# Patient Record
Sex: Female | Born: 1955 | Race: White | Hispanic: No | Smoking: Current every day smoker
Health system: Southern US, Community
[De-identification: ages and names within clinical notes are randomized; demographics above are authoritative.]

## PROBLEM LIST (undated history)

## (undated) DIAGNOSIS — K859 Acute pancreatitis without necrosis or infection, unspecified: Secondary | ICD-10-CM

## (undated) HISTORY — PX: OTHER SURGICAL HISTORY: SHX169

---

## 2002-07-02 ENCOUNTER — Emergency Department (HOSPITAL_COMMUNITY): Admission: EM | Admit: 2002-07-02 | Discharge: 2002-07-02 | Payer: Self-pay | Admitting: Emergency Medicine

## 2005-09-14 ENCOUNTER — Ambulatory Visit (HOSPITAL_COMMUNITY): Admission: RE | Admit: 2005-09-14 | Discharge: 2005-09-15 | Payer: Self-pay | Admitting: Neurological Surgery

## 2006-01-21 ENCOUNTER — Emergency Department (HOSPITAL_COMMUNITY): Admission: EM | Admit: 2006-01-21 | Discharge: 2006-01-21 | Payer: Self-pay | Admitting: Emergency Medicine

## 2017-05-21 ENCOUNTER — Inpatient Hospital Stay (HOSPITAL_COMMUNITY)
Admission: EM | Admit: 2017-05-21 | Discharge: 2017-05-24 | DRG: 439 | Disposition: A | Discharge status: Home / Self Care | Payer: Medicare Other | Attending: Internal Medicine | Admitting: Internal Medicine

## 2017-05-21 ENCOUNTER — Encounter (HOSPITAL_COMMUNITY): Payer: Self-pay

## 2017-05-21 ENCOUNTER — Emergency Department (HOSPITAL_COMMUNITY): Payer: Medicare Other

## 2017-05-21 DIAGNOSIS — D751 Secondary polycythemia: Secondary | ICD-10-CM | POA: Diagnosis present

## 2017-05-21 DIAGNOSIS — F17210 Nicotine dependence, cigarettes, uncomplicated: Secondary | ICD-10-CM | POA: Diagnosis present

## 2017-05-21 DIAGNOSIS — J449 Chronic obstructive pulmonary disease, unspecified: Secondary | ICD-10-CM | POA: Diagnosis present

## 2017-05-21 DIAGNOSIS — Z888 Allergy status to other drugs, medicaments and biological substances status: Secondary | ICD-10-CM | POA: Diagnosis not present

## 2017-05-21 DIAGNOSIS — B182 Chronic viral hepatitis C: Secondary | ICD-10-CM | POA: Diagnosis present

## 2017-05-21 DIAGNOSIS — R109 Unspecified abdominal pain: Secondary | ICD-10-CM

## 2017-05-21 DIAGNOSIS — Z9049 Acquired absence of other specified parts of digestive tract: Secondary | ICD-10-CM | POA: Diagnosis not present

## 2017-05-21 DIAGNOSIS — Z79891 Long term (current) use of opiate analgesic: Secondary | ICD-10-CM

## 2017-05-21 DIAGNOSIS — E039 Hypothyroidism, unspecified: Secondary | ICD-10-CM | POA: Diagnosis present

## 2017-05-21 DIAGNOSIS — G8929 Other chronic pain: Secondary | ICD-10-CM | POA: Diagnosis present

## 2017-05-21 DIAGNOSIS — Q453 Other congenital malformations of pancreas and pancreatic duct: Secondary | ICD-10-CM

## 2017-05-21 DIAGNOSIS — K859 Acute pancreatitis without necrosis or infection, unspecified: Secondary | ICD-10-CM | POA: Diagnosis present

## 2017-05-21 DIAGNOSIS — K861 Other chronic pancreatitis: Secondary | ICD-10-CM | POA: Diagnosis present

## 2017-05-21 DIAGNOSIS — K8050 Calculus of bile duct without cholangitis or cholecystitis without obstruction: Secondary | ICD-10-CM

## 2017-05-21 DIAGNOSIS — I872 Venous insufficiency (chronic) (peripheral): Secondary | ICD-10-CM | POA: Diagnosis present

## 2017-05-21 HISTORY — DX: Acute pancreatitis without necrosis or infection, unspecified: K85.90

## 2017-05-21 LAB — TYPE AND SCREEN
ABO/RH(D): B NEG
Antibody Screen: NEGATIVE

## 2017-05-21 LAB — URINALYSIS, ROUTINE W REFLEX MICROSCOPIC
Bilirubin Urine: NEGATIVE
Glucose, UA: NEGATIVE mg/dL
Hgb urine dipstick: NEGATIVE
Ketones, ur: 15 mg/dL — AB
Leukocytes, UA: NEGATIVE
Nitrite: NEGATIVE
Protein, ur: NEGATIVE mg/dL
Specific Gravity, Urine: 1.015 (ref 1.005–1.030)
pH: 6 (ref 5.0–8.0)

## 2017-05-21 LAB — COMPREHENSIVE METABOLIC PANEL
ALT: 30 U/L (ref 14–54)
AST: 43 U/L — ABNORMAL HIGH (ref 15–41)
Albumin: 4.1 g/dL (ref 3.5–5.0)
Alkaline Phosphatase: 138 U/L — ABNORMAL HIGH (ref 38–126)
Anion gap: 12 (ref 5–15)
BUN: 9 mg/dL (ref 6–20)
CO2: 23 mmol/L (ref 22–32)
Calcium: 9.6 mg/dL (ref 8.9–10.3)
Chloride: 104 mmol/L (ref 101–111)
Creatinine, Ser: 0.76 mg/dL (ref 0.44–1.00)
GFR calc Af Amer: >60 mL/min (ref >60)
GFR calc non Af Amer: >60 mL/min (ref >60)
Glucose, Bld: 184 mg/dL — ABNORMAL HIGH (ref 65–99)
Potassium: 3.4 mmol/L — ABNORMAL LOW (ref 3.5–5.1)
Sodium: 139 mmol/L (ref 135–145)
Total Bilirubin: 0.7 mg/dL (ref 0.3–1.2)
Total Protein: 7.9 g/dL (ref 6.5–8.1)

## 2017-05-21 LAB — I-STAT BETA HCG BLOOD, ED (MC, WL, AP ONLY): I-stat hCG, quantitative: <5 m[IU]/mL (ref <5)

## 2017-05-21 LAB — RAPID URINE DRUG SCREEN, HOSP PERFORMED
Amphetamines: NOT DETECTED
Barbiturates: NOT DETECTED
Benzodiazepines: NOT DETECTED
Cocaine: NOT DETECTED
Opiates: POSITIVE — AB
Tetrahydrocannabinol: NOT DETECTED

## 2017-05-21 LAB — CBC
HCT: 53.7 % — ABNORMAL HIGH (ref 36.0–46.0)
Hemoglobin: 17.8 g/dL — ABNORMAL HIGH (ref 12.0–15.0)
MCH: 30.7 pg (ref 26.0–34.0)
MCHC: 33.1 g/dL (ref 30.0–36.0)
MCV: 92.7 fL (ref 78.0–100.0)
Platelets: 199 10*3/uL (ref 150–400)
RBC: 5.79 MIL/uL — ABNORMAL HIGH (ref 3.87–5.11)
RDW: 13.6 % (ref 11.5–15.5)
WBC: 12.8 10*3/uL — ABNORMAL HIGH (ref 4.0–10.5)

## 2017-05-21 LAB — LIPASE, BLOOD: Lipase: 6340 U/L — ABNORMAL HIGH (ref 11–51)

## 2017-05-21 LAB — ABO/RH: ABO/RH(D): B NEG

## 2017-05-21 LAB — ETHANOL: Alcohol, Ethyl (B): <5 mg/dL (ref <5)

## 2017-05-21 MED ORDER — SODIUM CHLORIDE 0.9 % IV SOLN
INTRAVENOUS | Status: DC
  Administered 2017-05-21 (×2): via INTRAVENOUS

## 2017-05-21 MED ORDER — LEVOTHYROXINE SODIUM 50 MCG PO TABS
50.0000 ug | ORAL_TABLET | Freq: Every day | ORAL | Status: DC
  Administered 2017-05-22 – 2017-05-24 (×3): 50 ug via ORAL
  Filled 2017-05-21 (×3): qty 1

## 2017-05-21 MED ORDER — POTASSIUM CHLORIDE 10 MEQ/100ML IV SOLN
10.0000 meq | INTRAVENOUS | Status: AC
  Administered 2017-05-21 (×3): 10 meq via INTRAVENOUS
  Filled 2017-05-21 (×3): qty 100

## 2017-05-21 MED ORDER — HYDROMORPHONE HCL 1 MG/ML IJ SOLN
0.5000 mg | INTRAMUSCULAR | Status: AC | PRN
  Administered 2017-05-21 (×3): 0.5 mg via INTRAVENOUS
  Filled 2017-05-21 (×3): qty 0.5

## 2017-05-21 MED ORDER — ONDANSETRON HCL 4 MG/2ML IJ SOLN: 4.0000 mg | Freq: Four times a day (QID) | INTRAMUSCULAR | Status: DC | PRN

## 2017-05-21 MED ORDER — ENOXAPARIN SODIUM 40 MG/0.4ML ~~LOC~~ SOLN
40.0000 mg | SUBCUTANEOUS | Status: DC
  Administered 2017-05-21 – 2017-05-23 (×3): 40 mg via SUBCUTANEOUS
  Filled 2017-05-21 (×3): qty 0.4

## 2017-05-21 MED ORDER — ONDANSETRON HCL 4 MG PO TABS: 4.0000 mg | ORAL_TABLET | Freq: Four times a day (QID) | ORAL | Status: DC | PRN

## 2017-05-21 MED ORDER — ORAL CARE MOUTH RINSE
15.0000 mL | Freq: Two times a day (BID) | OROMUCOSAL | Status: DC
  Administered 2017-05-22 – 2017-05-23 (×4): 15 mL via OROMUCOSAL

## 2017-05-21 MED ORDER — SODIUM CHLORIDE 0.9 % IV SOLN
INTRAVENOUS | Status: DC
  Administered 2017-05-21 – 2017-05-22 (×6): via INTRAVENOUS

## 2017-05-21 MED ORDER — CHLORHEXIDINE GLUCONATE 0.12 % MT SOLN
15.0000 mL | Freq: Two times a day (BID) | OROMUCOSAL | Status: DC
  Administered 2017-05-22 – 2017-05-23 (×4): 15 mL via OROMUCOSAL
  Filled 2017-05-21 (×4): qty 15

## 2017-05-21 MED ORDER — HYDROMORPHONE HCL 1 MG/ML IJ SOLN
1.0000 mg | INTRAMUSCULAR | Status: DC | PRN
Start: 2017-05-21 — End: 2017-05-21
  Administered 2017-05-21: 1 mg via INTRAVENOUS
  Filled 2017-05-21: qty 1

## 2017-05-21 MED ORDER — SODIUM CHLORIDE 0.9 % IV BOLUS (SEPSIS)
1000.0000 mL | Freq: Once | INTRAVENOUS | Status: AC
  Administered 2017-05-21: 1000 mL via INTRAVENOUS

## 2017-05-21 MED ORDER — ONDANSETRON HCL 4 MG/2ML IJ SOLN
4.0000 mg | Freq: Once | INTRAMUSCULAR | Status: AC
  Administered 2017-05-21: 4 mg via INTRAVENOUS
  Filled 2017-05-21: qty 2

## 2017-05-21 MED ORDER — ONDANSETRON HCL 4 MG/2ML IJ SOLN: 4.0000 mg | Freq: Once | INTRAMUSCULAR | Status: DC

## 2017-05-21 MED ORDER — METHADONE HCL 10 MG PO TABS
130.0000 mg | ORAL_TABLET | Freq: Every day | ORAL | Status: DC
  Administered 2017-05-21 – 2017-05-24 (×4): 130 mg via ORAL
  Filled 2017-05-21: qty 26
  Filled 2017-05-21: qty 13
  Filled 2017-05-21: qty 26
  Filled 2017-05-21 (×3): qty 13

## 2017-05-21 MED ORDER — KETOROLAC TROMETHAMINE 30 MG/ML IJ SOLN
30.0000 mg | Freq: Four times a day (QID) | INTRAMUSCULAR | Status: DC | PRN
  Administered 2017-05-22 – 2017-05-23 (×4): 30 mg via INTRAVENOUS
  Filled 2017-05-21 (×4): qty 1

## 2017-05-21 MED ORDER — PROMETHAZINE HCL 25 MG/ML IJ SOLN
12.5000 mg | Freq: Once | INTRAMUSCULAR | Status: AC
  Administered 2017-05-21: 12.5 mg via INTRAVENOUS
  Filled 2017-05-21: qty 1

## 2017-05-21 NOTE — H&P (Signed)
Date: 05/21/2017               Patient Name:  Susan Ashley MRN: [id number]  DOB: Jul 05, 1956 Age / Sex: 61 y.o., female   PCP: Regino Bellow, MD         Medical Service: Internal Medicine Teaching Service         Attending Physician: Dr. Doneen Poisson, MD    First Contact: Zeb Comfort, MS IV  Pager: [phone]  Second Contact: Dr. John Giovanni Pager: [phone]       After Hours (After 5p/  First Contact Pager: [phone]  weekends / holidays): Second Contact Pager: [phone]   Chief Complaint: "Feeling sick to my stomach"  History of Present Illness: Patient is a 61 year old female with history of chronic pancreatitis with multiple admissions for acute-on-chronic pancreatitis, presenting with 4 days of worsening abdominal pain and nausea, as well as vomiting that started this morning. She first noticed mild mid-epigastric pain and nausea late last week but had been able to eat normally without vomiting. This morning, she was driving to a clinic when she suddenly started vomiting multiple times. She describes her vomit as non-bilious and non-bloody. Her abdominal pain also became more severe and she says it radiated to her back. Her last bowel movement was this morning and was normal. She denies current EtOH use and says her last drink was >10 years ago. She is a current smoker and started smoking in the past year, and endorses smoking about half a pack per day. She has been taking her home medications for COPD and methadone and denies starting any new medicines or OTC meds recently. Patient underwent cholecystectomy a few years ago. She denies recent abdominal trauma. The patient says she has had episodes like this about once every 2 years. She has not had diarrhea or constipation, fevers, chills, chest pain, SOB, or other systemic symptoms that have gone along with her recent symptoms.  Meds:  Current Meds  Medication Sig  . methadone (DOLOPHINE) 5 MG tablet Take 130 mg by  mouth daily.   Medications listed in PCP note in care everywhere: albuterol sulfate HFA 108 (90 Base) MCG/ACT inhaler beclomethasone dipropionate 40 mcg/actuation inhaler escitalopram oxalate 10 mg tablet gabapentin 300 mg capsule levothyroxine sodium 50 mcg tablet meloxicam 7.5 mg tablet methadone 5 mg tablet but takes 25 mg QD Commonly known as: DOLOPHINE 25 mg, Oral omeprazole 20 mg capsule  Allergies: Allergies as of 05/21/2017 - Review Complete 05/21/2017  Allergen Reaction Noted  . Duloxetine Other (See Comments) 05/12/2013  . Tramadol Swelling 05/21/2017  . Tape Other (See Comments) 08/29/2013   Past Medical History:  Diagnosis Date  . Pancreatitis    Per Care Everywhere: 1. Hypothyroidism, unspecified type TSH  Comprehensive metabolic panel  Lipid panel  2. Chronic obstructive pulmonary disease, unspecified COPD type (*)  3. Tobacco use disorder  4. Chronic pain disorder  5. Opioid use, unspecified with unspecified opioid-induced disorder (*) HCV Antibody Reflex to Qual NAA  HIV 1/0/2 Ag/Ab 4th Generation Preliminary Test w/Rflx  6. Chronic venous insufficiency Ambulatory referral to Vascular Surgery  7. History of Hep C 8. GAD 9. MDD  Family History: Patient denies family history of abdominal problems or recurrent pancreatitis.   Social History: Patient lives with her two grandchildren at home whom she takes care of. She denies ongoing EtOH use as per the HPI above, and is a 0.5 PPD smoker x 1 year.    Review of Systems:  Review of Systems  Constitutional: Negative for chills, fever and weight loss.  HENT: Negative.   Eyes: Negative.   Respiratory: Negative for cough, hemoptysis and shortness of breath.   Cardiovascular: Negative for chest pain and palpitations.  Gastrointestinal: Positive for abdominal pain, nausea and vomiting. Negative for constipation and diarrhea.  Genitourinary: Negative.   Skin: Negative for rash.  Neurological: Negative.     Physical Exam: Blood pressure 100/84, pulse 75, temperature 98.5 F (36.9 C), temperature source Oral, resp. rate 18, height 5\' 4"  (1.626 m), weight 148 lb 12.8 oz (67.5 kg), SpO2 93 %.   Physical Exam  Constitutional: She is oriented to person, place, and time. She appears well-developed and well-nourished. No distress.  HENT:  Head: Normocephalic and atraumatic.  Right Ear: External ear normal.  Left Ear: External ear normal.  Mouth/Throat: Oropharynx is clear and moist.  Eyes: Conjunctivae and EOM are normal. Pupils are equal, round, and reactive to light. Right eye exhibits no discharge. Left eye exhibits no discharge. No scleral icterus.  Neck: Normal range of motion. Neck supple. No tracheal deviation present.  Cardiovascular: Normal rate, regular rhythm, normal heart sounds and intact distal pulses.  Exam reveals no gallop and no friction rub.   No murmur heard. Pulmonary/Chest: Effort normal and breath sounds normal. No stridor. No respiratory distress. She has no wheezes. She has no rales. She exhibits no tenderness.  Abdominal: Soft. She exhibits no distension and no mass. There is tenderness. There is no rebound and no guarding. No hernia.  Abdomen TTP in the epigastric and LUQ region  Neurological: She is alert and oriented to person, place, and time. No cranial nerve deficit. Coordination normal.  Skin: Skin is warm. Capillary refill takes less than 2 seconds. She is not diaphoretic. No erythema.  Nursing note and vitals reviewed.  EKG: Personally reviewed, sinus bradycardia  CXR: Per radiology borderline cardiomegaly and low lung volumes.   Assessment & Plan by Problem: Active Problems:   Acute on chronic pancreatitis (HCC)  1. Acute on chronic pancreatitis: Lipase on presentation elevated to 6340, given history and exam findings of midepigastric pain radiating to the back, her presentation is most consistent with acute pancreatitis. Patient endorses history of  recurrent episodes of acute pancreatitis similar to this occurring every 2 years, making acute on chronic picture likely. However does not have clear etiology since denies EtOH use for >10 years, no family history of pancreatitis and her lipid profile [facility] in June at [facility] did not reveal hypertriglyceridemia. Questionable whether methadone (home med) could be etiology of acute pancreatitis. For time being will treat for symptom improvement.  - Continue NS IVF at 300 mL/hr - Phenergan/zofran PRN for N/V - Pain control: Methadone as below and ketorolac 30 mg/mL IV q6hr PRN for severe pain  - NPO for today, will advance to clears tomorrow pending improved N/V   2. Hx of opioid dependence: Currently being followed by methadone clinic. Per patient, she takes 130 mg QD but records from her PCP at OSH indicate 25 mg QD.  Questionable whether methadone could be etiology of acute pancreatitis as above, but will restart for pain control at this point.  - Continue methadone 130 mg daily. This is the dose reported by the patient. We will speak to her methadone clinic in the morning to confirm dose.   3. Electrolyte derangements: Patient presented with K+ of 3.4, will replete and continue to monitor with BMP qAM. - KCl 10 mEq over 60 mins x  3 doses  4. Leukocytosis: Likely secondary to stress reaction.  -Will continue to monitor with repeat CBC in AM.  5. Polycythemia: Hgb 17.8 and Hct 53.7 on admission. Review of care everywhere records reveals high Hct since 08/2015. Likely in the setting of cigarette smoking.  - Will perform smoking cessation counseling during this admission.   6. Hypothyroidism, unspecified type: Patient reports taking levothyroxine 50 mg daily and stopped a week ago, TSH checked 05/08/2017 was 6.69 in care everywhere.  - Will resume levothyroxine replacement at 50 mg daily as she was not consistent with her use.   7. Chronic obstructive pulmonary disease: Stable. Uses albuterol PRN at  home and doesn't use Anoro or Qvar.  -Will restart home albuterol PRN.   8. Tobacco use disorder: Spoke with patient about her recently starting smoking.  - Will perform smoking cessation counseling during this admission.   9. Hx of Hep C: +Hep C antibody and +HCV RNA during recent labs done 05/08/2017 in care everywhere. -.Pt will need outpatient follow-up with infectious disease   10. GAD: Per PCP records, was prescribed Escitalopram 10 mg QD but patient is not taking.  - Will not restart during this hospitalization, defer to outpatient.   11. MDD: As above for GAD.   DVT ppx: Lovenox  Diet: NPO Code: FULL Dispo: Admit patient to Inpatient with expected length of stay greater than 2 midnights.  Attestation for Student Documentation:  I personally was present and performed or re-performed the history, physical exam and medical decision-making activities of this service and have verified that the service and findings are accurately documented in the student's note.  John Giovanni, MD 05/22/2017, 12:00 AM  Signed: John Giovanni, MD 05/21/2017, 11:59 PM  Pager: [phone]

## 2017-05-21 NOTE — ED Notes (Signed)
Report given to Selena BattenKim, RN 6E. Pt will be going to 6E-12.

## 2017-05-21 NOTE — ED Provider Notes (Signed)
[facility]-EMERGENCY DEPT Provider Note   CSN: [id number] Arrival date & time: 05/21/17  0845     History   Chief Complaint Chief Complaint  Patient presents with  . Abdominal Pain    HPI Susan GaviaBarbara Ashley is a 61 y.o. female.  HPI Patient presents to the emergency room for evaluation of acute severe upper abdominal pain that started this morning. The patient states she suddenly started having severe abdominal pain associated with episodes of nausea and vomiting. The pain is severe in her upper abdomen and radiates to her back. Patient has history of pancreatitis and this feels very similar to previous bouts of pancreatitis. She thinks her pancreatitis was caused by gallbladder problems. She had a cholecystectomy a few years ago. She has had recurrent bouts of pancreatitis since that time. She denies any history of alcohol abuse. History of aortic aneurysm. No history of heart disease. Past Medical History:  Diagnosis Date  . Pancreatitis   additional PMHX from Care everywhere 1. Hypothyroidism, unspecified type TSH  Comprehensive metabolic panel  Lipid panel  2. Chronic obstructive pulmonary disease, unspecified COPD type (*)  3. Tobacco use disorder  4. Chronic pain disorder  5. Opioid use, unspecified with unspecified opioid-induced disorder (*) HCV Antibody Reflex to Qual NAA  HIV 1/0/2 Ag/Ab 4th Generation Preliminary Test w/Rflx  6. Chronic venous insufficiency Ambulatory referral to Vascular Surgery  7. History of Hep C 8. GAD 9. MDD    Past Surgical History:  Procedure Laterality Date  . GI Bleeding      OB History    No data available       Home Medications    Prior to Admission medications   Medication Sig Start Date End Date Taking? Authorizing Provider  methadone (DOLOPHINE) 5 MG tablet Take 130 mg by mouth daily.   Yes [provider]  Home meds per care everywhere albuterol sulfate HFA 108 (90 Base) MCG/ACT inhaler beclomethasone dipropionate 40  mcg/actuation inhaler escitalopram oxalate 10 mg tablet gabapentin 300 mg capsule levothyroxine sodium 50 mcg tablet meloxicam 7.5 mg tablet methadone 5 mg tablet Commonly known as: DOLOPHINE 25 mg, Oral omeprazole 20 mg capsule      Family History No family history on file.  Social History Social History  Substance Use Topics  . Smoking status: Current Every Day Smoker    Packs/day: 0.50    Types: Cigarettes  . Smokeless tobacco: Never Used  . Alcohol use No     Allergies   Duloxetine; Tramadol; and Tape   Review of Systems Review of Systems  Respiratory: Negative for chest tightness and shortness of breath.   All other systems reviewed and are negative.    Physical Exam Updated Vital Signs BP (!) 157/79   Pulse 62   Temp 98 F (36.7 C) (Oral)   Resp (!) 26   Ht 1.626 m (5\' 4" )   Wt 63.5 kg (140 lb)   SpO2 94%   BMI 24.03 kg/m   Physical Exam  Constitutional: She appears distressed.  HENT:  Head: Normocephalic and atraumatic.  Right Ear: External ear normal.  Left Ear: External ear normal.  Eyes: Conjunctivae are normal. Right eye exhibits no discharge. Left eye exhibits no discharge. No scleral icterus.  Neck: Neck supple. No tracheal deviation present.  Cardiovascular: Normal rate, regular rhythm and intact distal pulses.   Pulmonary/Chest: Effort normal and breath sounds normal. No stridor. No respiratory distress. She has no wheezes. She has no rales.  Abdominal: Soft. Bowel sounds  are normal. She exhibits no distension and no pulsatile midline mass. There is generalized tenderness and tenderness in the right upper quadrant and epigastric area. There is guarding. There is no rigidity and no rebound. No hernia.  Musculoskeletal: She exhibits no edema or tenderness.  Neurological: She is alert. She has normal strength. No cranial nerve deficit (no facial droop, extraocular movements intact, no slurred speech) or sensory deficit. She exhibits normal  muscle tone. She displays no seizure activity. Coordination normal.  Skin: Skin is warm. No rash noted. She is diaphoretic.  Psychiatric: She has a normal mood and affect.  Nursing note and vitals reviewed.    ED Treatments / Results  Labs (all labs ordered are listed, but only abnormal results are displayed) Labs Reviewed  LIPASE, BLOOD - Abnormal; Notable for the following:       Result Value   Lipase 6,340 (*)    All other components within normal limits  COMPREHENSIVE METABOLIC PANEL - Abnormal; Notable for the following:    Potassium 3.4 (*)    Glucose, Bld 184 (*)    AST 43 (*)    Alkaline Phosphatase 138 (*)    All other components within normal limits  CBC - Abnormal; Notable for the following:    WBC 12.8 (*)    RBC 5.79 (*)    Hemoglobin 17.8 (*)    HCT 53.7 (*)    All other components within normal limits  ETHANOL  URINALYSIS, ROUTINE W REFLEX MICROSCOPIC  RAPID URINE DRUG SCREEN, HOSP PERFORMED  I-STAT BETA HCG BLOOD, ED ([facility], [facility], [facility] ONLY)  TYPE AND SCREEN  ABO/RH    EKG Sinus bradycardia with a rate of 53 Normal intervals Normal access Normal ST-T waves No prior EKG for comparison Radiology Dg Chest Portable 1 View  Result Date: 05/21/2017 CLINICAL DATA:  Upper abdominal pain. EXAM: PORTABLE CHEST 1 VIEW COMPARISON:  03/10/2015 . FINDINGS: Mediastinum hilar structures normal. Borderline cardiomegaly. No pulmonary venous congestion. Low lung volumes. No focal infiltrate. No pleural effusion or pneumothorax . IMPRESSION: 1. Borderline cardiomegaly. 2. Low lung volumes. Electronically Signed   By: Maisie Fus  Register   On: 05/21/2017 09:39    Procedures Procedures (including critical care time)  Medications Ordered in ED Medications  sodium chloride 0.9 % bolus 1,000 mL (0 mLs Intravenous Stopped 05/21/17 1025)    And  0.9 %  sodium chloride infusion ( Intravenous New Bag/Given 05/21/17 1034)  HYDROmorphone (DILAUDID) injection 0.5 mg (0.5 mg Intravenous Given  05/21/17 1146)  ondansetron (ZOFRAN) injection 4 mg (4 mg Intravenous Given 05/21/17 0920)  ondansetron (ZOFRAN) injection 4 mg (4 mg Intravenous Given 05/21/17 1032)  promethazine (PHENERGAN) injection 12.5 mg (12.5 mg Intravenous Given 05/21/17 1142)     Initial Impression / Assessment and Plan / ED Course  I have reviewed the triage vital signs and the nursing notes.  Pertinent labs & imaging results that were available during my care of the patient were reviewed by me and considered in my medical decision making (see chart for details).   patient presents to the emergency room with complaints of severe upper abdominal pain similar to previous bouts of pancreatitis.  Patient denies any alcohol use. Her laboratory tests are consistent with recurrent pancreatitis.  Unclear what is triggering this.  We'll consult with medical service for admission. She will need further treatment to manage her pain and vomiting as she continues to have symptoms despite IV Dilaudid and multiple anti-emetics.  The patient may also need further  investigation as to the cause of her recurrent pancreatitis.  Final Clinical Impressions(s) / ED Diagnoses   Final diagnoses:  Acute pancreatitis, unspecified complication status, unspecified pancreatitis type    New Prescriptions New Prescriptions   No medications on file     Linwood Dibbles, MD 05/21/17 1210

## 2017-05-21 NOTE — ED Notes (Signed)
Pt had episode of vomiting on the floor. Pt cleaned up, and room. Given another emesis bag. Dr. Lynelle DoctorKnapp made aware. Gave recent dose of phenergan.

## 2017-05-21 NOTE — ED Triage Notes (Signed)
Per Pt, Pt has hx of pancreatitis and report RUQ pain that started this morning that radiates to her back. Pt is diaphoretic and tearful. Reports N/V/D.

## 2017-05-21 NOTE — ED Notes (Signed)
Family at bedside, pt is sleeping, resting comfortably. No further episodes of vomiting. Awaiting bed assignment.

## 2017-05-21 NOTE — ED Notes (Signed)
Spoke with admitting MD, requesting pain meds and nausea meds PRN. Reports they will be down to see patient soon and will place orders when they see the patient. Family at bedside.

## 2017-05-22 ENCOUNTER — Inpatient Hospital Stay (HOSPITAL_COMMUNITY): Payer: Medicare Other

## 2017-05-22 DIAGNOSIS — Q453 Other congenital malformations of pancreas and pancreatic duct: Secondary | ICD-10-CM

## 2017-05-22 DIAGNOSIS — K861 Other chronic pancreatitis: Secondary | ICD-10-CM

## 2017-05-22 DIAGNOSIS — D751 Secondary polycythemia: Secondary | ICD-10-CM

## 2017-05-22 DIAGNOSIS — K859 Acute pancreatitis without necrosis or infection, unspecified: Principal | ICD-10-CM

## 2017-05-22 LAB — BASIC METABOLIC PANEL
ANION GAP: 6 (ref 5–15)
BUN: 6 mg/dL (ref 6–20)
CALCIUM: 7 mg/dL — AB (ref 8.9–10.3)
CHLORIDE: 112 mmol/L — AB (ref 101–111)
CO2: 20 mmol/L — ABNORMAL LOW (ref 22–32)
Creatinine, Ser: 0.54 mg/dL (ref 0.44–1.00)
GFR calc non Af Amer: >60 mL/min (ref >60)
Glucose, Bld: 79 mg/dL (ref 65–99)
Potassium: 3.5 mmol/L (ref 3.5–5.1)
SODIUM: 138 mmol/L (ref 135–145)

## 2017-05-22 LAB — CBC
HCT: 48.2 % — ABNORMAL HIGH (ref 36.0–46.0)
HEMOGLOBIN: 15 g/dL (ref 12.0–15.0)
MCH: 29.3 pg (ref 26.0–34.0)
MCHC: 31.1 g/dL (ref 30.0–36.0)
MCV: 94.1 fL (ref 78.0–100.0)
Platelets: 143 10*3/uL — ABNORMAL LOW (ref 150–400)
RBC: 5.12 MIL/uL — ABNORMAL HIGH (ref 3.87–5.11)
RDW: 14 % (ref 11.5–15.5)
WBC: 11.5 10*3/uL — AB (ref 4.0–10.5)

## 2017-05-22 LAB — PROTIME-INR
INR: 1.35
PROTHROMBIN TIME: 16.8 s — AB (ref 11.4–15.2)

## 2017-05-22 LAB — HIV ANTIBODY (ROUTINE TESTING W REFLEX): HIV SCREEN 4TH GENERATION: NONREACTIVE

## 2017-05-22 MED ORDER — SODIUM CHLORIDE 0.9 % IV SOLN
INTRAVENOUS | Status: DC
  Administered 2017-05-22 – 2017-05-23 (×3): via INTRAVENOUS

## 2017-05-22 MED ORDER — PANCRELIPASE (LIP-PROT-AMYL) 36000-114000 UNITS PO CPEP
72000.0000 [IU] | ORAL_CAPSULE | Freq: Three times a day (TID) | ORAL | Status: DC
  Administered 2017-05-22 – 2017-05-24 (×5): 72000 [IU] via ORAL
  Filled 2017-05-22 (×5): qty 2

## 2017-05-22 NOTE — Progress Notes (Signed)
Subjective: Patient feels better this AM and has not had further episodes of nausea or vomiting. She feels hungry and is willing to try eating jello. She feels her pain is improved and better than it was yesterday. We discussed the plan regarding getting further imaging studies, the patient understands and is in agreement.   Objective:  Vital signs in last 24 hours: Vitals:   05/21/17 1652 05/21/17 2133 05/22/17 0439 05/22/17 0906  BP: (!) 156/74 100/84 124/63 (!) 141/66  Pulse: 69 75 68 73  Resp: 19 18 17 18   Temp: 98.6 F (37 C) 98.5 F (36.9 C) 98.4 F (36.9 C) 99.1 F (37.3 C)  TempSrc: Oral Oral  Oral  SpO2: 94% 93% 90% 92%  Weight: 67.5 kg (148 lb 12.8 oz)     Height:       Physical Exam  Constitutional: She is oriented to person, place, and time and well-developed, well-nourished, and in no distress. No distress.  HENT:  Head: Normocephalic and atraumatic.  Eyes: Pupils are equal, round, and reactive to light.  Neck: Normal range of motion.  Cardiovascular: Normal rate and regular rhythm.   Pulmonary/Chest: Effort normal and breath sounds normal. No respiratory distress. She has no wheezes.  Abdominal: Soft. Bowel sounds are normal. She exhibits no distension. There is no tenderness.  Neurological: She is alert and oriented to person, place, and time. No cranial nerve deficit.  Skin: Skin is warm and dry. She is not diaphoretic.   Lab Results  Component Value Date   WBC 11.5 (H) 05/22/2017   HGB 15.0 05/22/2017   HCT 48.2 (H) 05/22/2017   MCV 94.1 05/22/2017   PLT 143 (L) 05/22/2017   Lab Results  Component Value Date   NA 138 05/22/2017   K 3.5 05/22/2017   CL 112 (H) 05/22/2017   CO2 20 (L) 05/22/2017   Imaging:  RUQ U/S IMPRESSION: Dilated common bile duct containing 2 stones measuring up to 10 mm in diameter. This is new since the previous study. No intrahepatic ductal dilation. Increased hepatic echotexture compatible with fatty infiltrative  change. Previous cholecystectomy.  MRCP would be a useful next imaging step.  Electronically Signed   By: David  [place] M.D.   On: 05/22/2017 10:05  Assessment/Plan:  Active Problems:   Acute on chronic pancreatitis (HCC)  1. Acute on chronic pancreatitis: Patient is hungry today and willing to try clears. Her N/V has been well controlled. Recurrent episodes for several years. RUQ U/S today showed 2 stones measuring up to 10 mm in diameter in a dilated common bile duct, a new finding compared to prior RUQ U/S. Reducing fluid rate from 300 to 150 mL/hr. Spoke with [facility] who will see her here in the hospital. They recommend MRCP as next step for further imaging analysis of pancreatic head and CBD. Do not recommend ursodiol/other medical ppx for choledocholethiasis in setting of cholecystectomy as has not been shown to be effective. They will see her and make recs. - F/u GI recs  - F/u MRCP  - Advance to clears today - Continue NS IVF at 150 mL/hr - Zofran PRN for N/V  2. Pain control, hx of chronic pain and opioid dependence: Currently being followed by [facility] methadone clinic. Per patient, she takes 130 mg QD but records from her PCP at OSH indicate 25 mg QD. Have contacted methadone clinic, waiting to hear back.  - Continue methadone 130 mg daily, f/u methadone clinic  - Continue ketorolac 30 mg/mL  IV q6hr PRN for severe pain   Electrolyte derangements: Patient presented with K+ of 3.4, repleted with 10 mEq over 60 mins x  3 doses and now 3.5. Will CTM and replete PRN.  Leukocytosis: Likely secondary to stress reaction, WBC downtrended.  Polycythemia: Hgb 17.8 and Hct 53.7 on admission. Review of care everywhere records reveals high Hct since 08/2015. Likely in the setting of cigarette smoking.  - Will perform smoking cessation counseling during this admission.   Hypothyroidism, unspecified type: Patient reports taking levothyroxine 50 mg daily and stopped a week ago, TSH  checked 05/08/2017 was 6.69 in care everywhere. Will resume levothyroxine replacement at 50 mg daily as she was not consistent with her use.  - Continue home levothyroxine 50 mg   Chronic obstructive pulmonary disease: Stable. Uses albuterol PRN at home and doesn't use Anoro or Qvar.   Continue home albuterol PRN.   Tobacco use disorder: Will perform smoking cessation counseling during this admission.   Hx of Hep C: +Hep C antibody and +HCV RNA during recent labs done 05/08/2017 in care everywhere. - Pt will need outpatient follow-up with infectious disease   GAD: Per PCP records, was prescribed Escitalopram 10 mg QD but patient is not taking. Will not restart during this hospitalization, defer to outpatient.   Dispo: Anticipated discharge in approximately 1 day(s).   Zeb ComfortSaripalli, Tadao Emig, Medical Student 05/22/2017, 11:51 AM Pager: [phone]

## 2017-05-22 NOTE — Consult Note (Signed)
Referring Provider:  Dr. Loney Loh Primary Care Physician:  Regino Bellow, MD Primary Gastroenterologist:  Gentry Fitz  Reason for Consultation:  CBD stones  HPI: Susan Ashley is a 61 y.o. female with past medical history of chronic pancreatitis, chronic hepatitis C - treatment nave , COPD, history of cocaine use, currently on long-term methadone admitted to the hospital with abdominal pain. Upon initial evaluation, patient was found to have elevated lipase of 6340. WBC of 12.8, alkaline phosphatase 138, normal total bilirubin, normal ALT and AST of 43. Patient had ultrasound abdomen performed today which revealed CBD of 9.2 mm with possible common bile duct stones. GI is consulted for further evaluation.   Patient seen and examined at the bedside. According to patient he started noticing epigastric pain which was sharp and severe in nature with radiation to the back around 2 days ago. It was associated with nausea and one episode of non-bloody vomiting. Patient denied any fever. Denied diarrhea or constipation. Denied any blood in the stool or black stool. Her abdominal pain is improving since admission. She is able to tolerate diet without any further nausea or vomiting.    Patient had colonoscopy in the past but does not recall the date or the results of the procedure.  Past Medical History:  Diagnosis Date  . Pancreatitis     Past Surgical History:  Procedure Laterality Date  . GI Bleeding      Prior to Admission medications   Medication Sig Start Date End Date Taking? Authorizing Provider  methadone (DOLOPHINE) 5 MG tablet Take 130 mg by mouth daily.   Yes [provider]    Scheduled Meds: . chlorhexidine  15 mL Mouth Rinse BID  . enoxaparin (LOVENOX) injection  40 mg Subcutaneous Q24H  . levothyroxine  50 mcg Oral QAC breakfast  . mouth rinse  15 mL Mouth Rinse q12n4p  . methadone  130 mg Oral Daily   Continuous Infusions: . sodium chloride 150 mL/hr at  05/22/17 1421   PRN Meds:.ketorolac, ondansetron **OR** ondansetron (ZOFRAN) IV  Allergies as of 05/21/2017 - Review Complete 05/21/2017  Allergen Reaction Noted  . Duloxetine Other (See Comments) 05/12/2013  . Tramadol Swelling 05/21/2017  . Tape Other (See Comments) 08/29/2013    No family history on file.  Social History   Social History  . Marital status: Legally Separated    Spouse name: N/A  . Number of children: N/A  . Years of education: N/A   Occupational History  . Not on file.   Social History Main Topics  . Smoking status: Current Every Day Smoker    Packs/day: 0.50    Types: Cigarettes  . Smokeless tobacco: Never Used  . Alcohol use No  . Drug use: No  . Sexual activity: Not on file   Other Topics Concern  . Not on file   Social History Narrative  . No narrative on file    Review of Systems: Review of Systems  Constitutional: Positive for malaise/fatigue. Negative for chills and fever.  HENT: Negative for ear discharge, ear pain, hearing loss and tinnitus.   Eyes: Negative for blurred vision and double vision.  Respiratory: Positive for cough. Negative for hemoptysis and sputum production.   Cardiovascular: Negative for chest pain and palpitations.  Gastrointestinal: Positive for abdominal pain, heartburn, nausea and vomiting. Negative for blood in stool and melena.  Genitourinary: Negative for dysuria and urgency.  Musculoskeletal: Negative for myalgias and neck pain.  Skin: Negative for itching and rash.  Neurological:  Negative for focal weakness and seizures.  Endo/Heme/Allergies: Does not bruise/bleed easily.  Psychiatric/Behavioral: Negative for hallucinations and suicidal ideas.    Physical Exam: Vital signs: Vitals:   05/22/17 0439 05/22/17 0906  BP: 124/63 (!) 141/66  Pulse: 68 73  Resp: 17 18  Temp: 98.4 F (36.9 C) 99.1 F (37.3 C)   Last BM Date: 05/21/17 Physical Exam  Constitutional: She is oriented to person, place, and  time. She appears well-developed and well-nourished. No distress.  HENT:  Head: Normocephalic and atraumatic.  Mouth/Throat: Oropharynx is clear and moist.  Eyes: EOM are normal. Left eye exhibits no discharge. No scleral icterus.  Neck: Normal range of motion. Neck supple. No thyromegaly present.  Cardiovascular: Normal rate, regular rhythm and normal heart sounds.   Pulmonary/Chest: Effort normal and breath sounds normal. No respiratory distress.  Abdominal: Soft. Bowel sounds are normal. She exhibits no distension. There is tenderness. There is no rebound and no guarding.  Epigastric tenderness to palpation without any rebound  Musculoskeletal: Normal range of motion. She exhibits no edema.  Neurological: She is alert and oriented to person, place, and time.  Skin: Skin is warm. No erythema.  Psychiatric: She has a normal mood and affect. Her behavior is normal.   GI:  Lab Results:  Recent Labs  05/21/17 0853 05/22/17 0551  WBC 12.8* 11.5*  HGB 17.8* 15.0  HCT 53.7* 48.2*  PLT 199 143*   BMET  Recent Labs  05/21/17 0853 05/22/17 0551  NA 139 138  K 3.4* 3.5  CL 104 112*  CO2 23 20*  GLUCOSE 184* 79  BUN 9 6  CREATININE 0.76 0.54  CALCIUM 9.6 7.0*   LFT  Recent Labs  05/21/17 0853  PROT 7.9  ALBUMIN 4.1  AST 43*  ALT 30  ALKPHOS 138*  BILITOT 0.7   PT/INR No results for input(s): LABPROT, INR in the last 72 hours.   Studies/Results: Dg Chest Portable 1 View  Result Date: 05/21/2017 CLINICAL DATA:  Upper abdominal pain. EXAM: PORTABLE CHEST 1 VIEW COMPARISON:  03/10/2015 . FINDINGS: Mediastinum hilar structures normal. Borderline cardiomegaly. No pulmonary venous congestion. Low lung volumes. No focal infiltrate. No pleural effusion or pneumothorax . IMPRESSION: 1. Borderline cardiomegaly. 2. Low lung volumes. Electronically Signed   By: Maisie Fus  Register   On: 05/21/2017 09:39   US Abdomen Limited Ruq  Result Date: 05/22/2017 CLINICAL DATA:  One day  history of right-sided abdominal pain. History of previous cholecystectomy, episodes of pancreatitis. EXAM: ULTRASOUND ABDOMEN LIMITED RIGHT UPPER QUADRANT COMPARISON:  Report of an abdominal ultrasound of March 02, 2013 FINDINGS: Gallbladder: The gallbladder is surgically absent. Common bile duct: Diameter: 9.2 cm.There are intraluminal echoes compatible with common bile duct stones. One measures 10 mm in the other 8 mm in diameter. Liver: The hepatic echotexture is mildly increased diffusely. There is no focal mass or ductal dilation. IMPRESSION: Dilated common bile duct containing 2 stones measuring up to 10 mm in diameter. This is new since the previous study. No intrahepatic ductal dilation. Increased hepatic echotexture compatible with fatty infiltrative change. Previous cholecystectomy. MRCP would be a useful next imaging step. Electronically Signed   By: David  [place] M.D.   On: 05/22/2017 10:05    Impression/Plan: - Abnormal ultrasound showing possible common bile duct stones. - Abnormal LFTs. Patient with chronically elevated alkaline phosphatase since December 2016 in the range of around 130 to 150 . AST of 43. Normal ALT are normal  - Abdominal pain. Most likely acute  on chronic pancreatitis. - Chronic pancreatitis. Etiology undetermined. Most likely from previous alcohol use - Chronic hepatitis C. Treatment nave.  Recommendations ------------------------- - MRI-MRCP for further evaluation and to confirm CBD stones. Patient is afebrile. Hemodynamically stable. No signs of ascending cholangitis. - start  Creon 72,00 unis with meals for chronic abdominal pain which could be from her chronic pancreatitis. - Etiology of pancreatitis undetermined. Patient stopped drinking alcohol more than 10 years ago. She is status post cholecystectomy. Normal triglycerides. No new medications. No family history of chronic pancreatitis. She does smokes cigarettes. Although patient is declining alcohol use,  her AST is elevated with normal ALT.  - Recommend outpatient treatment for hepatitis C once acute issues are resolved - Repeat LFTs for tomorrow. I will get PT/INR for tomorrow as well. - GI will follow.    LOS: 1 day   Kathi DerParag Derrious Bologna  MD, FACP 05/22/2017, 2:30 PM  Pager [phone] If no answer or after 5 PM call [phone]

## 2017-05-22 NOTE — H&P (Signed)
Internal Medicine Attending Admission Note Date: 05/22/2017  Patient name: Susan Ashley Medical record number: [id number] Date of birth: 16-Oct-1956 Age: 61 y.o. Gender: female  I saw and evaluated the patient. I reviewed the resident's note and I agree with the resident's findings and plan as documented in the resident's note.  Chief Complaint(s): Severe abdominal pain with nausea and vomiting.  History - key components related to admission:  Susan Ashley is a 61 year old woman with a history of chronic recurrent pancreatitis, chronic hepatitis C, opioid use disorder currently on methadone, chronic obstructive pulmonary disease, and prior cocaine abuse who presents with the acute onset of severe abdominal pain radiating to the back associated with nausea and vomiting. She was in her usual state of health until 2 days prior to admission when she noted the onset of abdominal pain. Initially it was mild but on the day of admission became severe and was associated with vomiting. She denies any recent alcohol use, abdominal trauma, or unusual foods. She is on no new medications and denies a personal history of hyperlipidemia or family history of pancreatitis. She previously underwent a cholecystectomy for cholelithiasis. With the severe abdominal pain she presented to the emergency department and was admitted to the internal medicine teaching service for further evaluation and care after she was found to have a lipase over 6000.  Her methadone was continued and she was treated with IV fluids and bowel rest. Her pain has improved although is still present. A right upper quadrant ultrasound was concerning for possible choledocholithiasis and an MRCP was limited by motion artifact and the patient terminating the study early. It could not be definitively determined if there was choledocholithiasis, but the radiologist commented on a pancreatic divisum.  Physical Exam - key components related to  admission:  Vitals:   05/21/17 1652 05/21/17 2133 05/22/17 0439 05/22/17 0906  BP: (!) 156/74 100/84 124/63 (!) 141/66  Pulse: 69 75 68 73  Resp: 19 18 17 18   Temp: 98.6 F (37 C) 98.5 F (36.9 C) 98.4 F (36.9 C) 99.1 F (37.3 C)  TempSrc: Oral Oral  Oral  SpO2: 94% 93% 90% 92%  Weight: 148 lb 12.8 oz (67.5 kg)     Height:       Gen. well-developed, well-nourished, woman lying comfortably in bed in no acute distress. She was engaged and cooperative with the exam. She appears at least 10 years older than her stated age. Abdomen: Mildly distended, soft, diffuse tenderness with the greatest discomfort in the epigastric area. There is no rebound. Bowel sounds are normoactive.  Lab results:  Basic Metabolic Panel:  Recent Labs  40/98/11 0853 05/22/17 0551  NA 139 138  K 3.4* 3.5  CL 104 112*  CO2 23 20*  GLUCOSE 184* 79  BUN 9 6  CREATININE 0.76 0.54  CALCIUM 9.6 7.0*   Liver Function Tests:  Recent Labs  05/21/17 0853  AST 43*  ALT 30  ALKPHOS 138*  BILITOT 0.7  PROT 7.9  ALBUMIN 4.1    Recent Labs  05/21/17 0853  LIPASE 6,340*   CBC:  Recent Labs  05/21/17 0853 05/22/17 0551  WBC 12.8* 11.5*  HGB 17.8* 15.0  HCT 53.7* 48.2*  MCV 92.7 94.1  PLT 199 143*   Coagulation:  Recent Labs  05/22/17 1606  INR 1.35   Urine Drug Screen:  Positive for opiates (expected) Negative for cocaine, benzodiazepines, amphetamines, THC, and barbiturates.  Alcohol Level:  Recent Labs  05/21/17 0917  ETH <5  Urinalysis:  Hazy, yellow, specific gravity 1.015, pH 6.0, ketones 15, nitrite negative, leukocytes negative.  Misc. Labs:  Beta hCG undetectable HIV antibody nonreactive  Imaging results:  Dg Chest Portable 1 View  Result Date: 05/21/2017 CLINICAL DATA:  Upper abdominal pain. EXAM: PORTABLE CHEST 1 VIEW COMPARISON:  03/10/2015 . FINDINGS: Mediastinum hilar structures normal. Borderline cardiomegaly. No pulmonary venous congestion. Low lung  volumes. No focal infiltrate. No pleural effusion or pneumothorax . IMPRESSION: 1. Borderline cardiomegaly. 2. Low lung volumes. Electronically Signed   By: Maisie Fushomas  Register   On: 05/21/2017 09:39   Mr Abdomen Mrcp W Wo Contast  Result Date: 05/22/2017 CLINICAL DATA:  Back pain and abdominal pain. Common bile duct stone and acute pancreatitis. Four days of worsening abdominal pain and nausea. Vomiting starting this morning. Sonography on 05/22/2017 showed choledocholithiasis. Chronic hepatitis c. EXAM: MRI ABDOMEN WITHOUT CONTRAST (INCLUDING MRCP) TECHNIQUE: Multiplanar multisequence MR imaging of the abdomen was performed without administration of intravenous contrast. Heavily T2-weighted images of the biliary and pancreatic ducts were obtained, and three-dimensional MRCP images were rendered by post processing. COMPARISON:  Ultrasound of 05/22/2017 and prior MRI from 09/22/2011 FINDINGS: Despite efforts by the technologist and patient, motion artifact is present on today's exam and could not be eliminated. This reduces exam sensitivity and specificity. The patient terminated the exam after acquisition of 3 sequences. The sequences are adversely affected by motion artifact. Lower chest: Atelectasis or pneumonia in the left lower lobe with adjacent small left pleural effusion. Upper normal heart size. Hepatobiliary: Pneumobilia with gas causing filling defects in the biliary system. The common bile duct is nearly completely gas field, and accordingly the fluid sensitive sequences used to characterize the common bile duct are of limited utility. There does appear to be a distal air fluid level in the CBD, and CBD caliber is 1.3 cm. Gallbladder absent. There is some mild intrahepatic biliary dilatation. I do not see any focal low liver lesions on the series which were obtained. Pancreas: Extensive peripancreatic edema compatible with acute pancreatitis. Pancreas divisum is present and is likely a predisposing  factor. No obvious gas along the pancreas. No focal fluid collection along the pancreas to suggest abscess. Acute peripancreatic fluid collections are present. Spleen:  Unremarkable Adrenals/Urinary Tract: Bilateral perirenal fluid without hydronephrosis. Adrenal glands normal. Stomach/Bowel: Unremarkable where included. Vascular/Lymphatic:  Unremarkable Other: In addition to the peripancreatic edema and retroperitoneal edema tracking in the perirenal spaces, there is moderate perihepatic ascites and mild perisplenic ascites with fluid tracking down in the paracolic gutters. Musculoskeletal: Unremarkable IMPRESSION: 1. Acute pancreatitis with peripancreatic acute fluid collections but no obvious pseudocyst or abscess at this time. Edema tracks in the perirenal spaces, along the retroperitoneum, and there is mild upper abdominal ascites. The patient has pancreas divisum which is likely predisposing to the recurrent pancreatitis. 2. There is extensive pneumobilia, with gas filling most of the common bile duct. This disrupts signal in the CBD and makes it difficult to tell if there are concurrent stones. It may be that the echogenic appearance on sonography was due to gas in the CBD rather than stones. The common bile duct measures 1.3 cm in diameter, much of which may be a physiologic response to cholecystectomy given the patient's history; back on 09/22/2011 the CBD measured 1.1 cm in diameter. Mild intrahepatic biliary dilatation. 3. Unfortunately the patient was only able to tolerate the acquisition of 3 sequences before terminating the exam. One of these was the MRCP sequence an we performed 3D analysis  from this. Again, because of all the gas in the biliary system, the fluid sensitive MRCP sequences are of limited utility in this patient. There is a significant degree of motion artifact on 2 of the 3 sequences obtained. 4. Airspace opacity at the left lung base compatible with atelectasis or pneumonia. Associated  left pleural effusion. Electronically Signed   By: Gaylyn Rong M.D.   On: 05/22/2017 17:41   US Abdomen Limited Ruq  Result Date: 05/22/2017 CLINICAL DATA:  One day history of right-sided abdominal pain. History of previous cholecystectomy, episodes of pancreatitis. EXAM: ULTRASOUND ABDOMEN LIMITED RIGHT UPPER QUADRANT COMPARISON:  Report of an abdominal ultrasound of March 02, 2013 FINDINGS: Gallbladder: The gallbladder is surgically absent. Common bile duct: Diameter: 9.2 cm.There are intraluminal echoes compatible with common bile duct stones. One measures 10 mm in the other 8 mm in diameter. Liver: The hepatic echotexture is mildly increased diffusely. There is no focal mass or ductal dilation. IMPRESSION: Dilated common bile duct containing 2 stones measuring up to 10 mm in diameter. This is new since the previous study. No intrahepatic ductal dilation. Increased hepatic echotexture compatible with fatty infiltrative change. Previous cholecystectomy. MRCP would be a useful next imaging step. Electronically Signed   By: David  [place] M.D.   On: 05/22/2017 10:05   I personally reviewed the portable chest x-ray in there were no effusions, infiltrates, or masses. There was some calcification of the wall of the descending thoracic aorta. I did not personally review the ultrasound or MRCP but did review the radiologist's report on those particular studies.  Other results:  EKG: Normal sinus bradycardia at 53 bpm, left axis deviation, normal intervals, no significant Q waves, LVH by voltage in aVL, poor R wave progression, low precordial voltage, no ST or T-wave changes. No previous ECGs for comparison.  Assessment & Plan by Problem:  Susan Ashley is a 61 year old woman with a history of chronic recurrent pancreatitis, chronic hepatitis C, opioid use disorder currently on methadone, chronic obstructive pulmonary disease, and prior cocaine abuse who presents with the acute onset of severe abdominal  pain radiating to the back associated with nausea and vomiting. Her presentation, examination, and laboratory studies are consistent with acute on chronic pancreatitis. She was found to have a pancreatic divisum on MRCP. She states she has had bouts of pancreatitis on at least 3 occasions that occurred at a frequency of about once every 2 years. Although controversial, at this point we have no other etiology for her chronic recurrent pancreatitis and she may be in that subset of symptomatic pancreatic divisum patients.  1) Acute on chronic pancreatitis: Pancreatic divisum was seen on MRCP and may be the etiology as no other causes are currently available to explain her course. We appreciate GI's evaluation and are awaiting their opinion regarding the results of the MRCP and whether or not further intervention to the minor papilla would be indicated. In the meantime, we will slowly advance her diet as tolerated and continue pain management with her baseline methadone. We will also initiate pancreatic enzyme replacement as recommended by GI.  2) Disposition: Pending the completion of GI's evaluation and recommendations as well as her ability to maintain hydration orally without difficulty.

## 2017-05-23 LAB — COMPREHENSIVE METABOLIC PANEL
ALT: 17 U/L (ref 14–54)
AST: 26 U/L (ref 15–41)
Albumin: 2.4 g/dL — ABNORMAL LOW (ref 3.5–5.0)
Alkaline Phosphatase: 84 U/L (ref 38–126)
Anion gap: 7 (ref 5–15)
BUN: 5 mg/dL — ABNORMAL LOW (ref 6–20)
CHLORIDE: 111 mmol/L (ref 101–111)
CO2: 21 mmol/L — AB (ref 22–32)
CREATININE: 0.58 mg/dL (ref 0.44–1.00)
Calcium: 7.5 mg/dL — ABNORMAL LOW (ref 8.9–10.3)
GFR calc Af Amer: >60 mL/min (ref >60)
Glucose, Bld: 57 mg/dL — ABNORMAL LOW (ref 65–99)
Potassium: 3.2 mmol/L — ABNORMAL LOW (ref 3.5–5.1)
Sodium: 139 mmol/L (ref 135–145)
Total Bilirubin: 1.5 mg/dL — ABNORMAL HIGH (ref 0.3–1.2)
Total Protein: 5.3 g/dL — ABNORMAL LOW (ref 6.5–8.1)

## 2017-05-23 LAB — LIPASE, BLOOD: LIPASE: 256 U/L — AB (ref 11–51)

## 2017-05-23 MED ORDER — ACETAMINOPHEN 325 MG PO TABS
650.0000 mg | ORAL_TABLET | Freq: Four times a day (QID) | ORAL | Status: DC | PRN
  Administered 2017-05-23: 650 mg via ORAL
  Filled 2017-05-23: qty 2

## 2017-05-23 NOTE — Progress Notes (Signed)
Pebble Easler 12:32 PM  Subjective: Patient doing much better and is about to have her diet advanced and her history was discussed and she does not remember a previous ERCP and even denies having a previous endoscopy or EUS and also [facility] [place] despite some care everywhere Notes and she has not been on pancreatic enzymes and is fine in between these attacks  Objective: Vital signs stable afebrile no acute distress abdomen is soft nontender lipase decreased significantly liver tests okay white count slightly elevated stable MRCP reviewed not helpful regarding CBD stones question of pancreatic divisum and changes of pancreatitis  Assessment: Recurrent pancreatitis questionable etiology  Plan: Agree with advancing diet and hopefully home soon and I gave her my card and I will see her back in the office in one to 2 weeks to discuss ERCP because based on the elevation of her lipase that does suggest a biliary etiology and I think we should offer her a one time ERCP when her pancreatitis settles down but with her pneumobilia she either passed a rather large stone  or has had a sphincterotomy before and as an aside on one of her previous CTs at an outlying hospital pneumobilia was seen there as well  [facility] ClinicMAGOD,Lysle Yero E  Pager [phone] After 5PM or if no answer call [phone]

## 2017-05-23 NOTE — Progress Notes (Signed)
   Subjective: Patient feels well this AM. She was able to tolerate clear liquids for lunch and dinner yesterday without nausea or emesis. She has not had a BM. The patient feels her abdominal pain is improved. She understands her test results from the MRI and agrees the plan.   Objective:  Vital signs in last 24 hours: Vitals:   05/22/17 0906 05/22/17 1900 05/23/17 0541 05/23/17 0851  BP: (!) 141/66 (!) 144/72 (!) 146/72 (!) 152/58  Pulse: 73 73 67 66  Resp: 18 17 16 16   Temp: 99.1 F (37.3 C) 99.4 F (37.4 C) 98.9 F (37.2 C) 98.3 F (36.8 C)  TempSrc: Oral Oral Oral Oral  SpO2: 92% 94% 93% 95%  Weight:  71.2 kg (157 lb)    Height:       Physical Exam  Constitutional: She appears well-developed and well-nourished. No distress.  HENT:  Head: Normocephalic and atraumatic.  Cardiovascular: Normal rate, regular rhythm and normal heart sounds.   No murmur heard. Pulmonary/Chest: Effort normal and breath sounds normal. She has no wheezes.  Abdominal: Soft. Bowel sounds are normal. She exhibits no distension.  Mild epigastric tenderness  Neurological: She is alert.  Skin: Skin is warm and dry.   Assessment/Plan:  Active Problems:   Acute on chronic pancreatitis (HCC)   Pancreatic divisum  1. Acute on chronic pancreatitis: Patient tolerated clear liquids well yesterday. She is hungry and will have clears for breakfast but is willing to try a soft solid diet for lunch.  - Advance to soft solids  -Discontinue IV fluids - Zofran PRN for N/V  2. Pancreatic divisum: RUQ U/S yesterday demonstrated ?CBD stones, however MRCP showed extensive pneumobilia with CBD to 1.3 cm and was unable to confirm stones. Radiologist commented on pancreatic divisum which could predispose her to recurrent pancreatitis. Will f/u with GI recs regarding further management of pancreatic divisum.  - Continue Creon w/ meals per GI  - F/u GI recs   3. Pain control: History of opioid dependence and [facility]. Per patient she takes methadone 130 mg. Have contacted pain clinic regarding her dose but given likelihood of discharge soon will not alter her regimen.  - Continue methadone 130 mg QD - Continue toradol PRN   4. Hypothyroidism: History of hypothyroidism at home on levo 50, was recently found to be sub-therapeutic per OSH lab but patient endorses not taking medications consistently. Therefore have restarted on home dose. - Continue levothyroxine 50 mg QD  -She will need thyroid studies repeated as outpatient   Diet: Soft solids DVT ppx: Lovenox  Dispo: Anticipated discharge in approximately 1 day(s).   Zeb ComfortSaripalli, Srinivas, Medical Student 05/23/2017, 9:52 AM Pager: [phone]  Attestation for Student Documentation:  I personally was present and performed or re-performed the history, physical exam and medical decision-making activities of this service and have verified that the service and findings are accurately documented in the student's note.  John Giovanniathore, Luvia Orzechowski, MD 05/23/2017, 10:23 AM

## 2017-05-23 NOTE — Progress Notes (Signed)
Internal Medicine Attending  Date: 05/23/2017  Patient name: Susan GaviaBarbara Ashley Medical record number: [id number] Date of birth: [birth year] Age: 61 y.o. Gender: female  I saw and evaluated the patient. I reviewed the resident/sub-intern's note by Dr. Troy Sineathore/Mr. Saripalli and I agree with the resident/sub-intern's findings and plans as documented in their progress note.  When seen on rounds this morning she was feeling better and tolerated the clear liquid diet well. She is ready for slow advancement of the diet. Examination revealed less abdominal tenderness than the day before. We are awaiting GI's opinion on whether or not further intervention would be prudent given the pancreatic divisum and her recurrent pancreatitis.

## 2017-05-24 DIAGNOSIS — K859 Acute pancreatitis without necrosis or infection, unspecified: Secondary | ICD-10-CM

## 2017-05-24 MED ORDER — PANCRELIPASE (LIP-PROT-AMYL) 36000-114000 UNITS PO CPEP: 72000.0000 [IU] | ORAL_CAPSULE | Freq: Three times a day (TID) | ORAL | 0 refills | Status: AC

## 2017-05-24 MED ORDER — LEVOTHYROXINE SODIUM 50 MCG PO TABS: 50.0000 ug | ORAL_TABLET | Freq: Every day | ORAL | 0 refills | Status: AC

## 2017-05-24 NOTE — Progress Notes (Signed)
   Subjective: Feels well, had a normal BM, no N/V, tolerated regular diet yesterday, pain is significantly improved. Feels ready to go home today.   Objective:  Vital signs in last 24 hours: Vitals:   05/23/17 1824 05/23/17 2044 05/24/17 0527 05/24/17 0942  BP: (!) 153/72 (!) 150/84 132/64 (!) 149/71  Pulse: 78 73 70 70  Resp: 17 16 16 17   Temp: 99.7 F (37.6 C) (!) 100.4 F (38 C) 99.1 F (37.3 C) 99.3 F (37.4 C)  TempSrc: Oral Oral Oral Oral  SpO2: 90% 93% 93% 93%  Weight:  70.8 kg (156 lb)    Height:  5' 3.5" (1.613 m)     Physical Exam  Constitutional: She is oriented to person, place, and time. She appears well-developed and well-nourished. No distress.  HENT:  Head: Normocephalic.  Cardiovascular: Normal rate, regular rhythm and normal heart sounds.   Pulmonary/Chest: Breath sounds normal.  Abdominal: Soft. She exhibits no distension.  No significant tenderness on exam.  Musculoskeletal: She exhibits no edema.  Neurological: She is alert and oriented to person, place, and time.   Active Problems:   Acute on chronic pancreatitis (HCC)   Pancreatic divisum  Assessment/Plan:  1. Acute on chronic pancreatitis, resolved: Tolerating soft solid diet. Counseled about avoiding greasy foods as much as possible while at home and staying hydrated. Has some residual epigastric pain though significant decreased from prior.   She will be started on pancreatic enzymes TID.  2. Pancreatic divisum: Per GI, they will see her as an outpatient for possible ERCP in 2 weeks.   3. 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 Will not need any further doses today. Encourage her to follow up with her pain clinic.   4. Hypothyroidism: Restarted home levothyroxine 50. Denies side effects. Encouraged patient to be compliant as she was previously subtherapeutic. She will need repeat thyroid studies as an outpatient to ensure adherence and that she is therapeutic.  Diet:  Regular DVT ppx: Lovenox Dispo: Anticipated discharge today.  Zeb ComfortSaripalli, Srinivas, Medical Student 05/24/2017, 1:09 PM Pager: [phone]  Attestation for Student Documentation:  I personally was present and performed or re-performed the history, physical exam and medical decision-making activities of this service and have verified that the service and findings are accurately documented in the student's note.  Gwynn BurlyWallace, Rina Adney, DO 05/24/2017, 4:52 PM

## 2017-05-24 NOTE — Progress Notes (Signed)
[facility] Progress Note  Bettina GaviaBarbara Ardelean 61 y.o. Oct 30, 1956  CC:  Pancreatitis, abnormal ultrasound/MRCP   Subjective: Patient is feeling better. Tolerating soft diet. Opening of mild epigastric soreness. Denied any nausea, vomiting, diarrhea or constipation.  ROS : Negative for chest pain and shortness of breath   Objective: Vital signs in last 24 hours: Vitals:   05/24/17 0527 05/24/17 0942  BP: 132/64 (!) 149/71  Pulse: 70 70  Resp: 16 17  Temp: 99.1 F (37.3 C) 99.3 F (37.4 C)    Physical Exam:  General:  Alert, cooperative, no distress, appears stated age  Head:  Normocephalic, without obvious abnormality, atraumatic  Eyes:  , EOM's intact,   Lungs:   Clear to auscultation bilaterally, respirations unlabored  Heart:  Regular rate and rhythm, S1, S2 normal  Abdomen:   Soft, Mild epigastric tenderness to palpation, bowel sounds active all four quadrants,  no masses, no peritoneal signs   Extremities: Extremities normal, atraumatic, no  edema  Pulses: 2+ and symmetric    Lab Results:  Recent Labs  05/22/17 0551 05/23/17 0444  NA 138 139  K 3.5 3.2*  CL 112* 111  CO2 20* 21*  GLUCOSE 79 57*  BUN 6 5*  CREATININE 0.54 0.58  CALCIUM 7.0* 7.5*    Recent Labs  05/23/17 0444  AST 26  ALT 17  ALKPHOS 84  BILITOT 1.5*  PROT 5.3*  ALBUMIN 2.4*    Recent Labs  05/22/17 0551  WBC 11.5*  HGB 15.0  HCT 48.2*  MCV 94.1  PLT 143*    Recent Labs  05/22/17 1606  LABPROT 16.8*  INR 1.35      Assessment/Plan: - Abnormal ultrasound showing possible common bile duct stones. MRCP showing extensive pneumobilia with gas filling most of the common bile duct which may correspond to findings of abnormal ultrasound. It was limited study - Abdominal pain. Most likely acute on chronic pancreatitis. - Chronic pancreatitis. Etiology undetermined. Most likely from previous alcohol use - Chronic hepatitis C. Treatment  nave.  Recommendations ------------------------- - Patient's abdominal pain is improving. Tolerating soft diet. LFTs normal now. - Continue Creon as prescribed for possible chronic pancreatitis - Okay to discharge home from GI standpoint. - Follow-up with Dr. Ewing SchleinMagod in 1 to 2 weeks after discharge to discuss outpatient ERCP. - GI will sign off. Call us back if needed   Kathi DerParag Shemaiah Round MD, FACP 05/24/2017, 10:00 AM  Pager [phone]  If no answer or after 5 PM call [phone]

## 2017-05-24 NOTE — Progress Notes (Signed)
Patient discharged to home with daughter. All discharged instructions and scripts reviewed. All followup appts reviewed.  IV removed. All belongings in tow. Patient left unit in stable condition in wheelchair.  Avelina LaineKimberly Shakai Dolley RN

## 2017-05-24 NOTE — Progress Notes (Signed)
Internal Medicine Attending  Date: 05/24/2017  Patient name: Susan GaviaBarbara Ashley Medical record number: [id number] Date of birth: 02/18/1956 Age: 61 y.o. Gender: female  I saw and evaluated the patient. I reviewed the resident/sub-intern's note by Dr. Earlene PlaterWallace and Susan Ashley and I agree with the resident/sub-intern's findings and plans as documented in their note.  When seen on rounds this morning Susan Ashley felt much better. Physical exam revealed minimal epigastric tenderness and this is a marked improvement from admission. She's felt to be stable for discharge home with follow-up in GI in 1-2 weeks to discuss an ERCP.

## 2017-05-24 NOTE — Care Management Note (Signed)
Case Management Note  Patient Details  Name: Bettina GaviaBarbara Shankle MRN: [id number] Date of Birth: 01/30/1956  Subjective/Objective:                 Patient with order to DC to home. Chart reviewed, no CM consult, orders, HH needs, or medication needs identified at this time.  PCP RAMOS, ARLENE G     Action/Plan:  Return to home at DC.   Expected Discharge Date:  05/24/17               Expected Discharge Plan:  Home/Self Care  In-House Referral:     Discharge planning Services  CM Consult  Post Acute Care Choice:    Choice offered to:     DME Arranged:    DME Agency:     HH Arranged:    HH Agency:     Status of Service:  Completed, signed off  If discussed at [employer] Length of Stay Meetings, dates discussed:    Additional Comments:  Lawerance SabalDebbie Maddyx Wieck, RN 05/24/2017, 11:15 AM

## 2017-05-24 NOTE — Discharge Summary (Signed)
Name: Susan Ashley MRN: [id number] DOB: 06/14/1956 61 y.o. PCP: Regino Ashley, Susan G, MD  Date of Admission: 05/21/2017  8:58 AM Date of Discharge: 05/24/2017 Attending Physician: Dr. Doneen PoissonLawrence Ashley   Discharge Diagnosis: 1. Acute on chronic pancreatitis 2. Pancreatic divisum   Active Problems:   Acute on chronic pancreatitis [facility](HCC)   Pancreatic divisum  Discharge Medications: Allergies as of 05/24/2017      Reactions   Duloxetine Other (See Comments)   Could not tolerate   Tramadol Swelling   Lips swelling   Tape Other (See Comments)   Crying, Anxiety, Panic Attacks      Medication List    TAKE these medications   levothyroxine 50 MCG tablet Commonly known as:  SYNTHROID, LEVOTHROID Take 1 tablet (50 mcg total) by mouth daily before breakfast. Start taking on:  05/25/2017   lipase/protease/amylase [id number] UNITS Cpep capsule Commonly known as:  CREON Take 2 capsules (72,000 Units total) by mouth 3 (three) times daily with meals.   methadone 5 MG tablet Commonly known as:  DOLOPHINE Take 130 mg by mouth daily.      Disposition and follow-up:   [facility].Susan Ashley [facility].  At the hospital follow up visit please address:  1.  Will be seen by GI two weeks after discharge for further management/intervention of pancreatic divisum.  Patient needs follow up for treatment naive Hep C.  2.  Labs / imaging needed at time of follow-up: Recommend repeat CMET and CBC.  Check TSH in 4-6 weeks.  3.  Pending labs/ test needing follow-up: None  Follow-up Appointments: Follow-up Information    Vida RiggerMagod, Marc, MD. Schedule an appointment as soon as possible for a visit.   Specialty:  Gastroenterology Why:  in 1- 2 weeks for hospital follow up. Contact information: [address] [place] [address] [phone]        Regino Ashley, Susan G, MD. Call.   Specialty:  Family Medicine Why:  for hospital follow up  appointment with your primary doctor. Contact information: [address] [place] [phone]           Hospital Course by problem list: Active Problems:   Acute on chronic pancreatitis [facility](HCC)   Pancreatic divisum   Acute on chronic pancreatitis: Presented with lipase of 6000, N/V, epigastric pain radiating to back, with history of multiple prior episodes of pancreatitis. Was resuscitated with aggressive IVF and antiemetics. Her diet was slowly advanced over the course of 3 days. Her pain was well controlled with her home methadone. By day of discharge, she could tolerate regular foods without incident and had normal bowel function.  She was discharged on Creon TID for possible chronic pancreatitis.  Pancreatic divisum: Etiology of her pancreatitis was unclear on presentation as she endorses no EtOH use and has no other risk factors. MRI found evidence of significant pneumobilia as well as pancreas divisum which could predispose her to recurrent pancreatitis. Patient also had history of cholecystectomy and possible sphincterotomy. GI was consulted who would follow the patient and discuss ERCP with her as an outpatient.  She will follow up with [facility] in 1-2 weeks. She was provided their contact information at discharge.  Polycythemia:Hgb 17.8 and Hct 53.7 on admission. Records showed she had high Hct since 08/2015. Likely in the setting of cigarette smoking.   Hypothyroidism, unspecified type: Patient had a history of being prescribed levothyroxine but was subtherapeutic at OSH  records prior to admission. Was restarted and maintained on her home dose of levothyroxine 50 and encouraged to take as scheduled.   She will need repeat TSH levels in 4-6 weeks.   Hx ofHep C: +Hep C antibody and +HCV RNA during recent labs done 05/08/2017 in care everywhere. Pt will need outpatient follow-up with infectious disease   GAD: Per PCPrecords, was prescribed  Escitalopram 10 mg QD but patient is not taking. Will not restart during this hospitalization, defer to outpatient for decision whether to restart.   Pain control: There was some discrepancy of methadone dose per PCP notes and patient report.  Attempts to contact her methadone clinic were unsuccessful.  She was continued on her reported methadone dose and her pain was well controlled on this.  Encouraged her to follow up with her pain clinic.  Discharge Vitals:   BP (!) 149/71 (BP Location: Right Arm)   Pulse 70   Temp 99.3 F (37.4 C) (Oral)   Resp 17   Ht 5' 3.5" (1.613 m)   Wt 156 lb (70.8 kg)   SpO2 93%   BMI 27.20 kg/m   Pertinent Labs, Studies, and Procedures:  05/21/2017: Lipase 6,340 U/L* (Ref range: 11 - 51 U/L) 05/23/2017: Lipase 256 U/L* (Ref range: 11 - 51 U/L)  Lab Results  Component Value Date   WBC 11.5 (H) 05/22/2017   HGB 15.0 05/22/2017   HCT 48.2 (H) 05/22/2017   MCV 94.1 05/22/2017   PLT 143 (L) 05/22/2017   Lab Results  Component Value Date   NA 139 05/23/2017   K 3.2 (L) 05/23/2017   CL 111 05/23/2017   CO2 21 (L) 05/23/2017   Imaging: 1. CXR  IMPRESSION: 1. Borderline cardiomegaly.  2. Low lung volumes.   Electronically Signed   By: Maisie Fus  Register   On: 05/21/2017 09:39  2. RUQ US  IMPRESSION: Dilated common bile duct containing 2 stones measuring up to 10 mm in diameter. This is new since the previous study. No intrahepatic ductal dilation. Increased hepatic echotexture compatible with fatty infiltrative change. Previous cholecystectomy.  MRCP would be a useful next imaging step.   Electronically Signed   By: David  [place] M.D.   On: 05/22/2017 10:05  3. MRCP   IMPRESSION: 1. Acute pancreatitis with peripancreatic acute fluid collections but no obvious pseudocyst or abscess at this time. Edema tracks in the perirenal spaces, along the retroperitoneum, and there is mild upper abdominal ascites. The patient has pancreas  divisum which is likely predisposing to the recurrent pancreatitis. 2. There is extensive pneumobilia, with gas filling most of the common bile duct. This disrupts signal in the CBD and makes it difficult to tell if there are concurrent stones. It may be that the echogenic appearance on sonography was due to gas in the CBD rather than stones. The common bile duct measures 1.3 cm in diameter, much of which may be a physiologic response to cholecystectomy given the patient's history; back on 09/22/2011 the CBD measured 1.1 cm in diameter. Mild intrahepatic biliary dilatation. 3. Unfortunately the patient was only able to tolerate the acquisition of 3 sequences before terminating the exam. One of these was the MRCP sequence an we performed 3D analysis from this. Again, because of all the gas in the biliary system, the fluid sensitive MRCP sequences are of limited utility in this patient. There is a significant degree of motion artifact on 2 of the 3 sequences obtained. 4. Airspace opacity at the left lung base  compatible with atelectasis or pneumonia. Associated left pleural effusion.   Electronically Signed   By: Gaylyn Rong M.D.   On: 05/22/2017 17:41   Discharge Instructions: Discharge Instructions    Call MD for:  persistant nausea and vomiting    Complete by:  As directed    Call MD for:  severe uncontrolled pain    Complete by:  As directed    Diet - low sodium heart healthy    Complete by:  As directed    Discharge instructions    Complete by:  As directed    We have given you a 30 day prescription for your Synthroid to take for hypothyroidism and a 30 day prescription for the Pancreatic Enzymes that you have been taking in the hospital.  You will take this 3 times per day.  Please make a hospital follow up appointment with your primary care doctor and the GI doctor who saw you while in the hospital.  Their contact infromation is attached to your discharge  instructions.  Please follow up with your methadone clinic as well.  Please continue to advance your diet slowly as tolerated.   Increase activity slowly    Complete by:  As directed      Signed: Gwynn Burly, DO 05/24/2017, 4:52 PM   Pager: [phone]

## 2017-06-06 ENCOUNTER — Other Ambulatory Visit: Payer: Self-pay | Admitting: Gastroenterology

## 2017-07-02 ENCOUNTER — Other Ambulatory Visit: Payer: Self-pay | Admitting: Gastroenterology

## 2017-07-03 ENCOUNTER — Ambulatory Visit (HOSPITAL_COMMUNITY): Admission: RE | Admit: 2017-07-03 | Payer: Medicare Other | Source: Ambulatory Visit | Admitting: Gastroenterology

## 2017-07-03 ENCOUNTER — Encounter (HOSPITAL_COMMUNITY): Admission: RE | Payer: Self-pay | Source: Ambulatory Visit

## 2017-07-03 ENCOUNTER — Encounter (HOSPITAL_COMMUNITY): Payer: Self-pay | Admitting: Anesthesiology

## 2017-07-03 SURGERY — ERCP, WITH INTERVENTION IF INDICATED
Anesthesia: General

## 2017-07-03 MED ORDER — PROPOFOL 10 MG/ML IV BOLUS
INTRAVENOUS | Status: AC
  Filled 2017-07-03: qty 20

## 2017-07-03 MED ORDER — SUCCINYLCHOLINE CHLORIDE 200 MG/10ML IV SOSY
PREFILLED_SYRINGE | INTRAVENOUS | Status: AC
  Filled 2017-07-03: qty 10

## 2017-07-03 MED ORDER — LIDOCAINE 2% (20 MG/ML) 5 ML SYRINGE
INTRAMUSCULAR | Status: AC
  Filled 2017-07-03: qty 5

## 2017-07-03 MED ORDER — ONDANSETRON HCL 4 MG/2ML IJ SOLN
INTRAMUSCULAR | Status: AC
  Filled 2017-07-03: qty 2

## 2017-07-03 MED ORDER — DEXAMETHASONE SODIUM PHOSPHATE 10 MG/ML IJ SOLN
INTRAMUSCULAR | Status: AC
  Filled 2017-07-03: qty 1

## 2017-07-03 MED ORDER — FENTANYL CITRATE (PF) 100 MCG/2ML IJ SOLN
INTRAMUSCULAR | Status: AC
  Filled 2017-07-03: qty 2

## 2018-05-31 IMAGING — US US ABDOMEN LIMITED
1 series · 14 of 25 positions shown · non-contrast
Comparison: Report of an abdominal ultrasound March 02, 2013

CLINICAL DATA: One day history of right-sided abdominal pain.
History of previous cholecystectomy, episodes of pancreatitis.

EXAM:
ULTRASOUND ABDOMEN LIMITED RIGHT UPPER QUADRANT

[Series 1: us abdomen limited · 0.23mm/px · 14 of 34 slices shown]
[im 1/34]
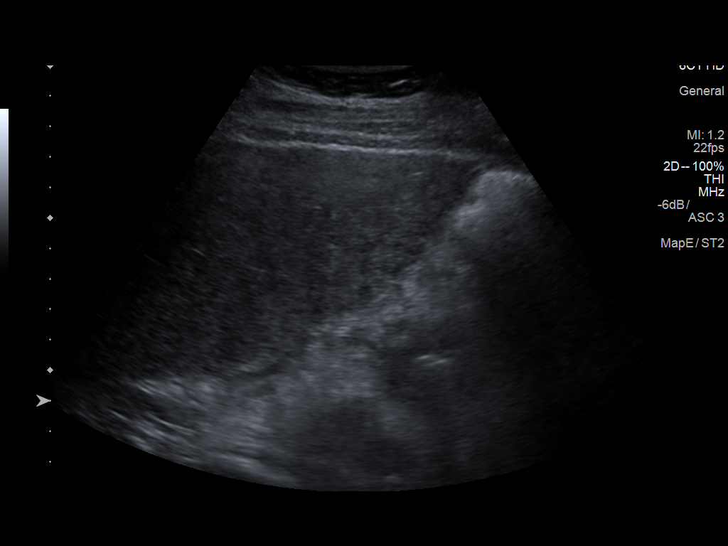
[im 3/34]
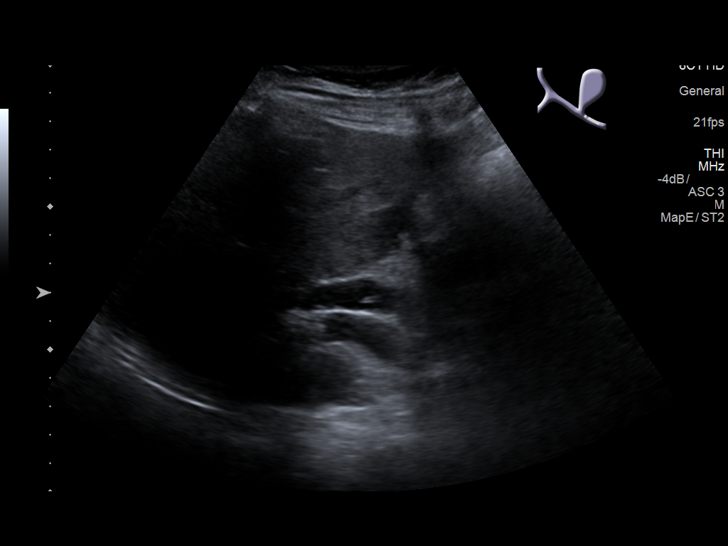
[im 6/34]
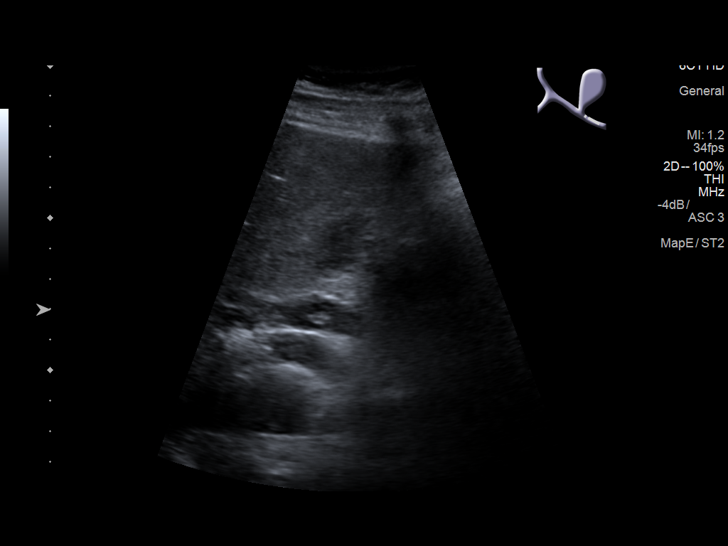
[im 9/34]
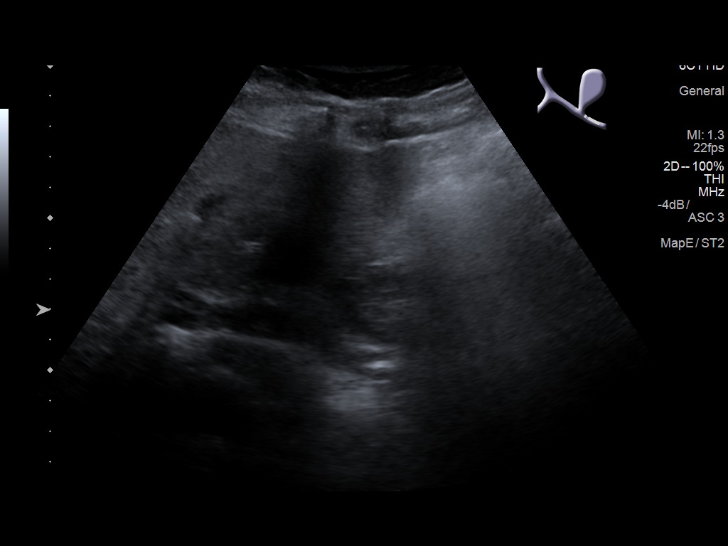
[im 12/34]
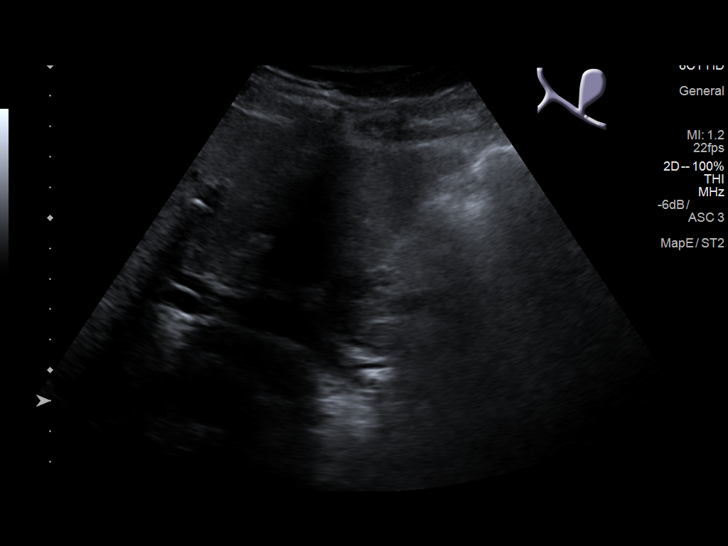
[im 13/34]
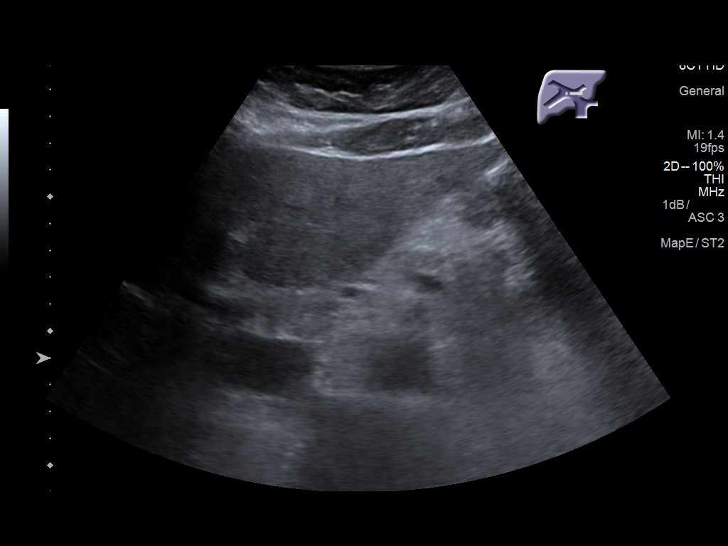
[im 16/34]
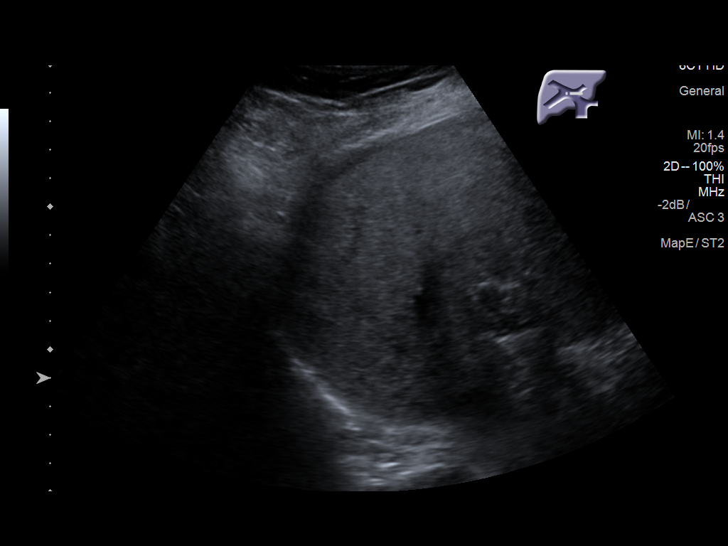
[im 18/34]
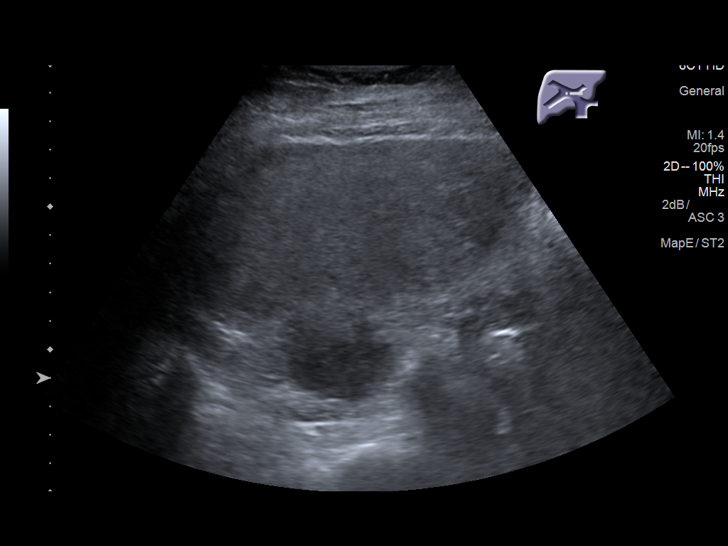
[im 21/34]
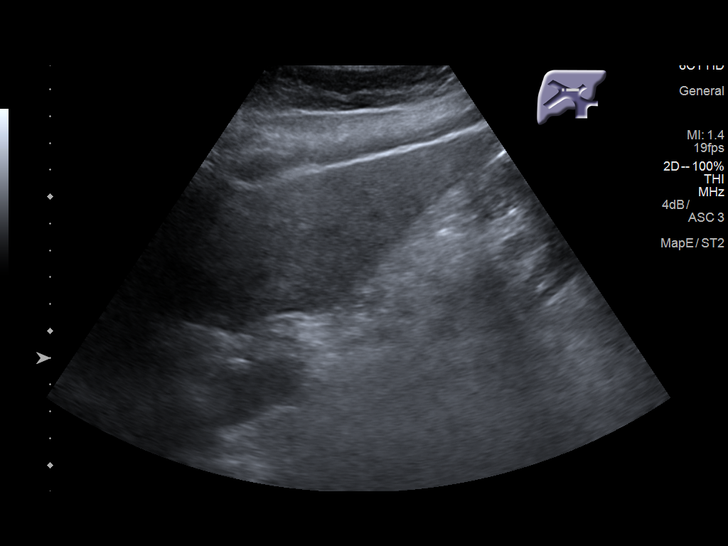
[im 23/34]
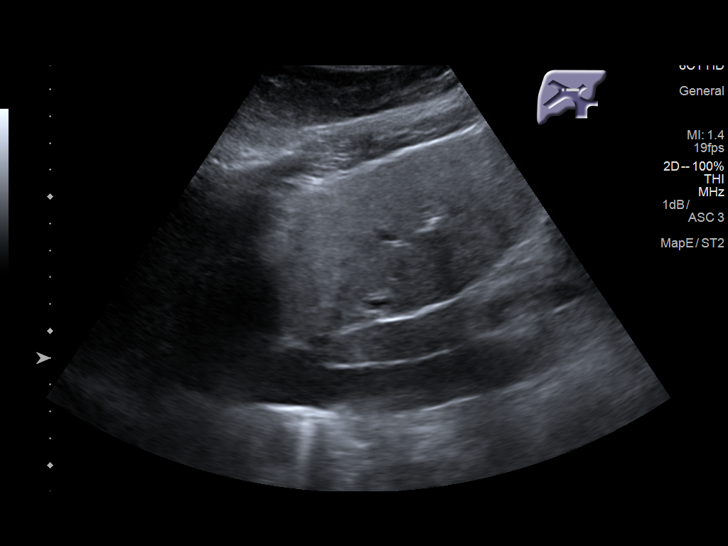
[im 25/34]
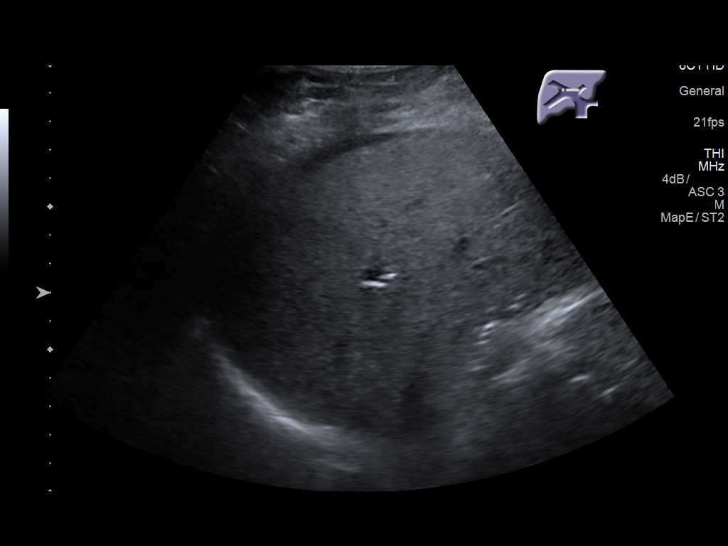
[im 28/34]
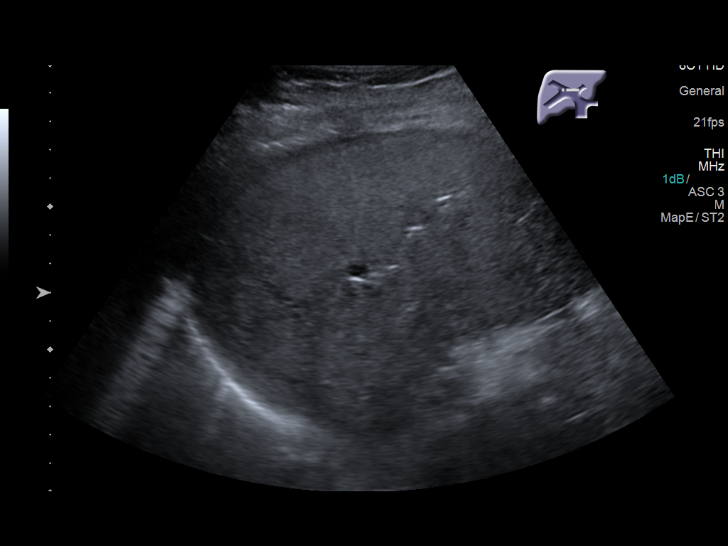
[im 31/34]
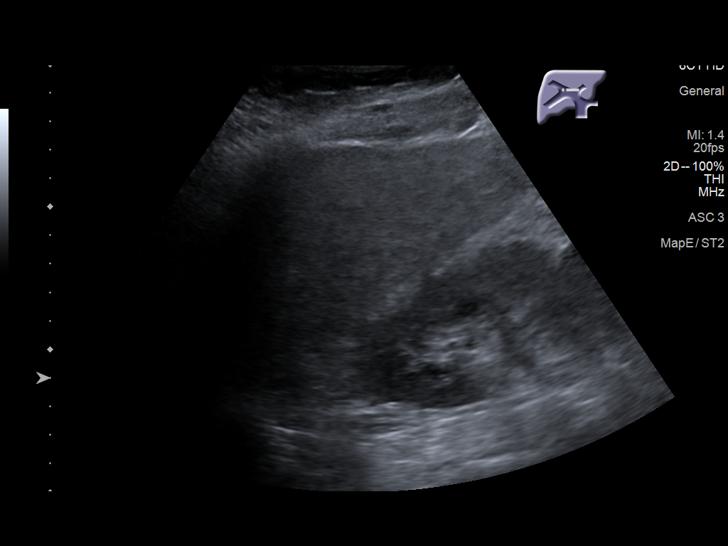
[im 34/34]
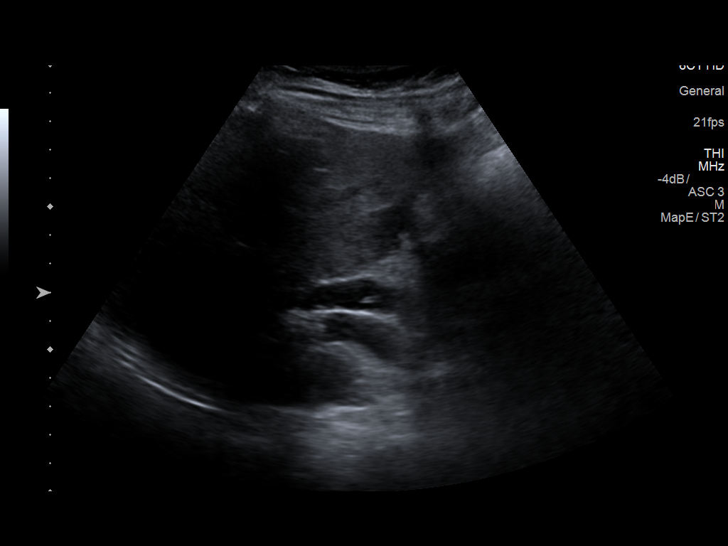

[14 of 25 positions shown; findings below may reference images not displayed]

FINDINGS: Gallbladder:

The gallbladder is surgically absent.

Common bile duct:

Diameter: 9.2 cm.There are intraluminal echoes compatible with
common bile duct stones. One measures 10 mm in the other 8 mm in
diameter.

Liver:

The hepatic echotexture is mildly increased diffusely. There is no
focal mass or ductal dilation.
IMPRESSION: Dilated common bile duct containing 2 stones measuring up to 10 mm
in diameter. This is new since the previous study. No intrahepatic
ductal dilation. Increased hepatic echotexture compatible with fatty
infiltrative change. Previous cholecystectomy.

MRCP would be a useful next imaging step.

## 2021-12-20 ENCOUNTER — Encounter: Payer: Self-pay | Admitting: Gastroenterology
